# Patient Record
Sex: Male | Born: 1965 | Race: White | Hispanic: No | Marital: Married | Smoking: Never smoker
Health system: Southern US, Community
[De-identification: ages and names within clinical notes are randomized; demographics above are authoritative.]

## PROBLEM LIST (undated history)

## (undated) DIAGNOSIS — G4730 Sleep apnea, unspecified: Secondary | ICD-10-CM

---

## 2007-11-24 ENCOUNTER — Ambulatory Visit: Payer: Self-pay | Admitting: Unknown Physician Specialty

## 2013-01-09 ENCOUNTER — Ambulatory Visit: Payer: Self-pay | Admitting: Unknown Physician Specialty

## 2013-02-12 ENCOUNTER — Ambulatory Visit: Payer: Self-pay | Admitting: Otolaryngology

## 2015-11-09 ENCOUNTER — Other Ambulatory Visit: Payer: Self-pay | Admitting: Orthopedic Surgery

## 2015-11-09 DIAGNOSIS — M542 Cervicalgia: Secondary | ICD-10-CM

## 2015-11-09 DIAGNOSIS — Z77018 Contact with and (suspected) exposure to other hazardous metals: Secondary | ICD-10-CM

## 2015-11-11 ENCOUNTER — Ambulatory Visit
Admission: RE | Admit: 2015-11-11 | Discharge: 2015-11-11 | Disposition: A | Discharge status: Home / Self Care | Payer: BLUE CROSS/BLUE SHIELD | Source: Ambulatory Visit | Attending: Orthopedic Surgery | Admitting: Orthopedic Surgery

## 2015-11-11 DIAGNOSIS — Z77018 Contact with and (suspected) exposure to other hazardous metals: Secondary | ICD-10-CM

## 2015-11-13 ENCOUNTER — Ambulatory Visit
Admission: RE | Admit: 2015-11-13 | Discharge: 2015-11-13 | Disposition: A | Discharge status: Home / Self Care | Payer: BLUE CROSS/BLUE SHIELD | Source: Ambulatory Visit | Attending: Orthopedic Surgery | Admitting: Orthopedic Surgery

## 2015-11-13 ENCOUNTER — Other Ambulatory Visit: Payer: Self-pay

## 2015-11-13 DIAGNOSIS — M542 Cervicalgia: Secondary | ICD-10-CM

## 2015-11-18 ENCOUNTER — Other Ambulatory Visit: Payer: Self-pay

## 2015-12-01 ENCOUNTER — Ambulatory Visit: Payer: BLUE CROSS/BLUE SHIELD | Admitting: Anesthesiology

## 2015-12-06 ENCOUNTER — Ambulatory Visit: Payer: BLUE CROSS/BLUE SHIELD | Attending: Anesthesiology | Admitting: Anesthesiology

## 2015-12-06 ENCOUNTER — Encounter: Payer: Self-pay | Admitting: Anesthesiology

## 2015-12-06 VITALS — BP 82/61 | HR 68 | Temp 96.7°F | Resp 16 | Ht 73.0 in | Wt 247.0 lb

## 2015-12-06 DIAGNOSIS — M5412 Radiculopathy, cervical region: Secondary | ICD-10-CM | POA: Diagnosis not present

## 2015-12-06 DIAGNOSIS — M47812 Spondylosis without myelopathy or radiculopathy, cervical region: Secondary | ICD-10-CM | POA: Insufficient documentation

## 2015-12-06 DIAGNOSIS — M542 Cervicalgia: Secondary | ICD-10-CM | POA: Diagnosis present

## 2015-12-06 DIAGNOSIS — M25512 Pain in left shoulder: Secondary | ICD-10-CM | POA: Diagnosis not present

## 2015-12-06 DIAGNOSIS — M25519 Pain in unspecified shoulder: Secondary | ICD-10-CM | POA: Diagnosis present

## 2015-12-06 NOTE — Progress Notes (Signed)
Safety precautions to be maintained throughout the outpatient stay will include: orient to surroundings, keep bed in low position, maintain call bell within reach at all times, provide assistance with transfer out of bed and ambulation.  

## 2015-12-07 NOTE — Progress Notes (Signed)
Subjective:  Patient ID: IVAAN SILERIO, male    DOB: 1966/02/03  Age: 50 y.o. MRN: [id number]  CC: Neck Pain and Shoulder Pain   HPI DONQUEZ PIOLI presents for new onset left shoulder pain. This is also associated with neck pain. Jamarques reports that his discomfort began following a recent work out about a month and a half ago. He noticed severe onset of left hairy scapular posterior back pain with some radiating pain from his neck into the posterior portion of his left shoulder and radiating into the back of the left upper arm. Since that time he has utilized some chiropractic manipulation with no improvement as well as an oral prednisone Dosepak with no improvement. Pain had been up to a maximum VAS score of 10 but is now down to a 1-4. It has shown some improvement over the last several days. He denies any problems with upper extremity strength or function. No numbness or tingling are noted in the hand or wrist area. He has refrain from heavy lifting since that event and he does note that he does have some pain upon awakening and sleeping at night. Pain is described as sharp and shooting and he did have a recent MRI revealed some evidence of degenerative disc changes at C5-6 and C6-7 with some foraminal stenosis and thecal ventral impingement.  History Raydon has no past medical history on file.   He has no past surgical history on file.   His family history includes Cancer in his maternal grandfather, mother, and paternal grandfather; Depression in his father and paternal grandfather; Hyperlipidemia in his father; Hypertension in his father.He reports that he has never smoked. He does not have any smokeless tobacco history on file. His alcohol and drug histories are not on file.   ---------------------------------------------------------------------------------------------------------------------- History reviewed. No pertinent past medical history.  History reviewed. No pertinent past  surgical history.  Family History  Problem Relation Age of Onset  . Cancer Mother   . Depression Father   . Hyperlipidemia Father   . Hypertension Father   . Cancer Maternal Grandfather   . Cancer Paternal Grandfather   . Depression Paternal Grandfather     Social History  Substance Use Topics  . Smoking status: Never Smoker   . Smokeless tobacco: Not on file  . Alcohol Use: Not on file    ---------------------------------------------------------------------------------------------------------------------- Social History   Social History  . Marital Status: Married    Spouse Name: N/A  . Number of Children: N/A  . Years of Education: N/A   Social History Main Topics  . Smoking status: Never Smoker   . Smokeless tobacco: None  . Alcohol Use: None  . Drug Use: None  . Sexual Activity: Not Asked   Other Topics Concern  . None   Social History Narrative  . None      ----------------------------------------------------------------------------------------------------------------------  ROS Review of Systems   Negative pulmonary except for sleep apnea  negative cardiac  negative GI  negative GU  Objective:  BP 82/61 mmHg  Pulse 68  Temp(Src) 96.7 F (35.9 C) (Oral)  Resp 16  Ht 6\' 1"  (1.854 m)  Wt 247 lb (112.038 kg)  BMI 32.59 kg/m2  SpO2 99%  Physical Exam   Pupils are equally round and reactive to light with extraocular muscles intact Heart is regular rate and rhythm without murmurs Lungs clear also rotation bilaterally without wheezes Inspection of the atlantooccipital joint reveals some pain with lateral motion and anterior-posterior flexion and extension. There  is no crepitus. He has good range of motion with good strength in the splenius capitis muscles and peripheral neck musculature. He has some mild tenderness in the left posterior trapezius muscle and his upper shoulder strength to flexion and extension at the upper arms and wrists appears to  be intact grip strength is also intact with sensation intact     Assessment & Plan:   Stanford was seen today for neck pain and shoulder pain.  Diagnoses and all orders for this visit:  Cervicalgia -     Ambulatory referral to Physical Therapy  Cervical radiculitis -     Ambulatory referral to Physical Therapy     ----------------------------------------------------------------------------------------------------------------------  Problem List Items Addressed This Visit    None    Visit Diagnoses    Cervicalgia    -  Primary    Relevant Orders    Ambulatory referral to Physical Therapy    Cervical radiculitis        Relevant Orders    Ambulatory referral to Physical Therapy       ----------------------------------------------------------------------------------------------------------------------  1. Cervicalgia  - Ambulatory referral to Physical Therapy  2. Cervical radiculitis Left C5 distribution - Ambulatory referral to Physical Therapy    ----------------------------------------------------------------------------------------------------------------------  I am having Mr. Martindelcampo maintain his aspirin, amoxicillin, and cetirizine.   Meds ordered this encounter  Medications  . aspirin 81 MG tablet    Sig: Take 81 mg by mouth daily.  Marland Kitchen amoxicillin (AMOXIL) 875 MG tablet    Sig: Take 875 mg by mouth 2 (two) times daily.  . cetirizine (ZYRTEC) 10 MG tablet    Sig: Take 10 mg by mouth daily.       Follow-up:At this point I feel that Kevaughn is made some good progress with conservative care. We have talked about options regarding further management including restriction a certain weight lifting techniques and I'm going to also send him for physical therapy evaluation and management with possible TENS application and traction.  Return in about 1 month (around 01/03/2016) for evaluation. We've also talked about consideration or possibly a cervical epidural  steroid injection if this condition persists or worsen at this point I do not feel that it is indicated   Molli Barrows, MD

## 2016-01-03 ENCOUNTER — Ambulatory Visit: Payer: BLUE CROSS/BLUE SHIELD | Admitting: Anesthesiology

## 2016-03-15 ENCOUNTER — Other Ambulatory Visit: Payer: Self-pay

## 2016-06-09 IMAGING — MR MR CERVICAL SPINE W/O CM
5 series · 31 of 48 positions shown · non-contrast
Comparison: None.

CLINICAL DATA: Neck and left shoulder and arm pain for 2 weeks.
Pain began after working out.

EXAM:
MRI CERVICAL SPINE WITHOUT CONTRAST
TECHNIQUE: Multiplanar, multisequence MR imaging of the cervical spine was
performed. No intravenous contrast was administered.

[Series 3: T2 · sagittal · 3.0mm · 0.66mm/px · 6 of 12 slices shown (1 of 2)]
[im 1/12]
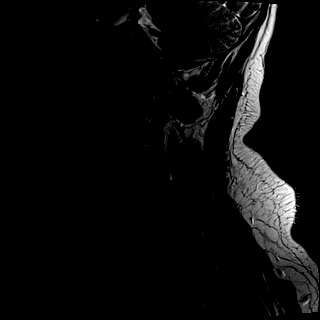
[im 3/12]
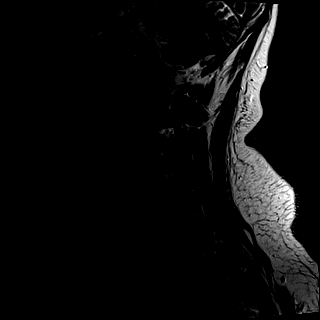
[im 5/12]
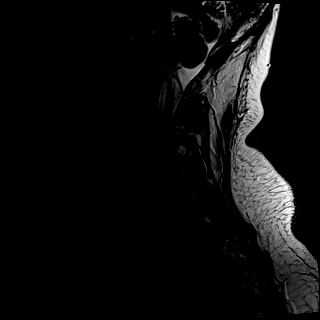
[im 7/12]
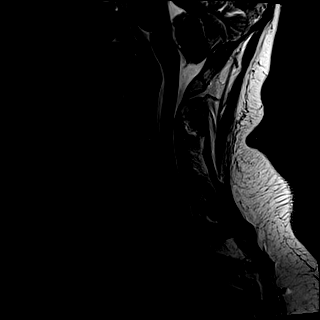
[im 9/12]
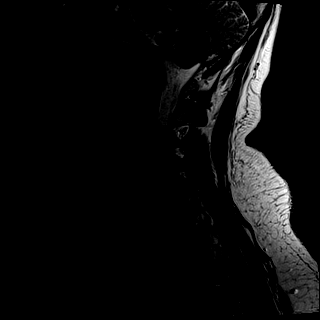
[im 12/12]
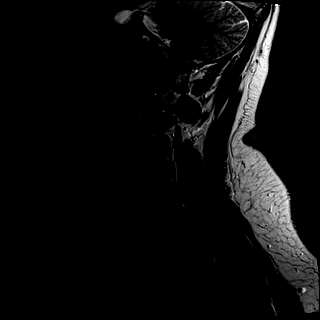

[Series 4: T1 · sagittal · 3.0mm · 0.66mm/px · 6 of 12 slices shown]
[im 1/12]
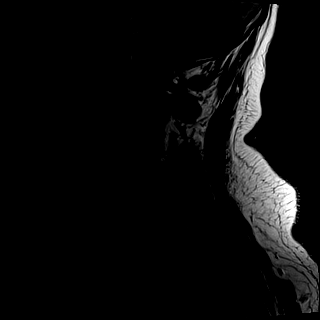
[im 3/12]
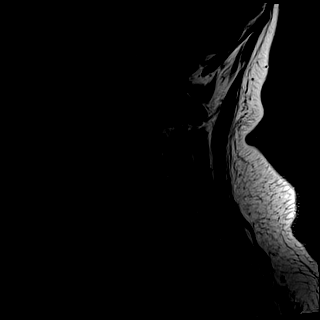
[im 5/12]
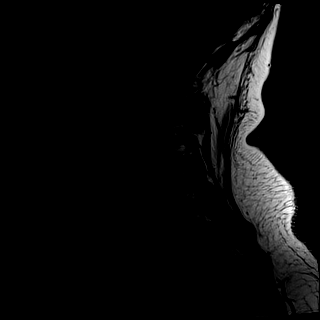
[im 7/12]
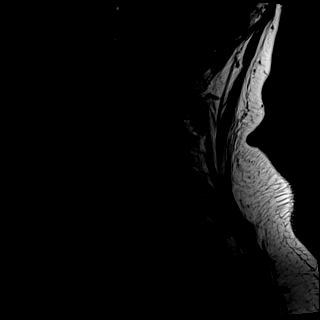
[im 9/12]
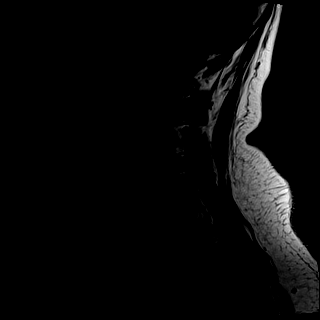
[im 12/12]
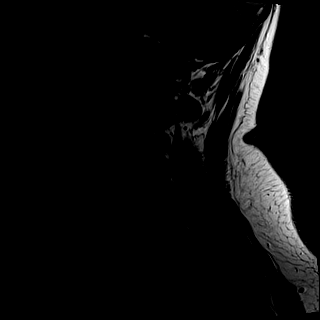

[Series 5: T2 · axial · 3.0mm · 0.56mm/px · z∈[-64,+44]mm · 9 of 30 slices shown (2 of 2)]
[im 1/30]
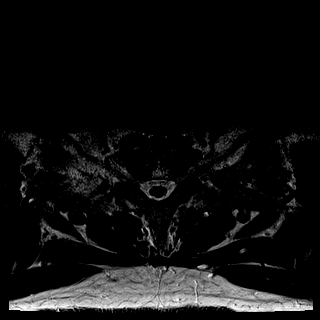
[im 5/30]
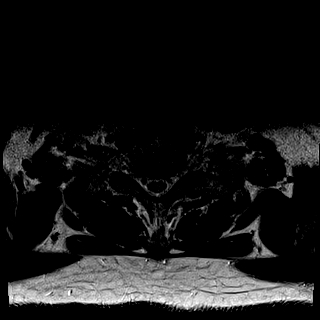
[im 9/30]
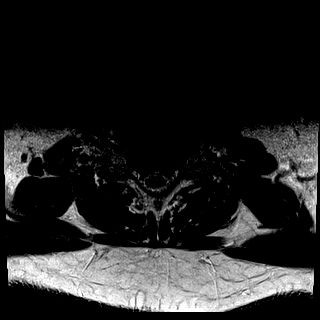
[im 13/30]
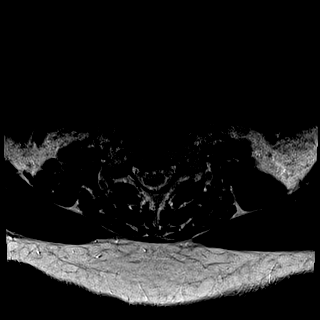
[im 15/30]
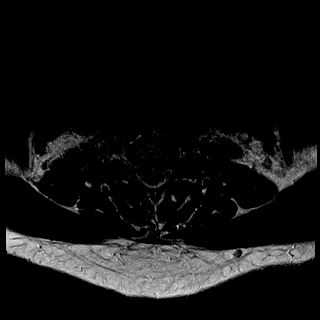
[im 17/30]
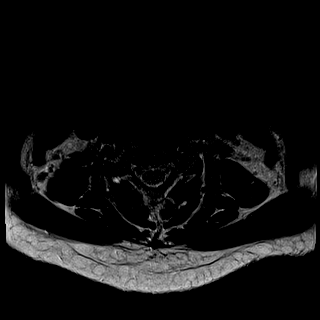
[im 21/30]
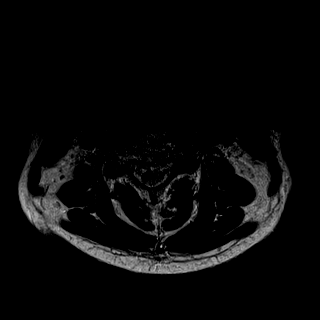
[im 25/30]
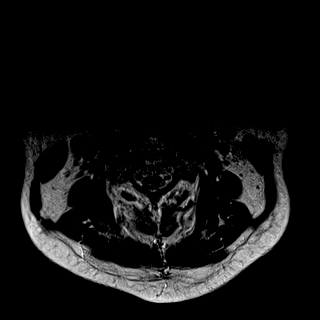
[im 30/30]
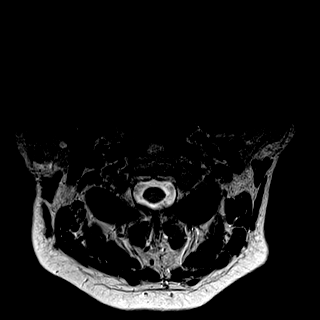

[Series 6: tir sag · sagittal · 3.0mm · 0.41mm/px · 6 of 12 slices shown]
[im 1/12]
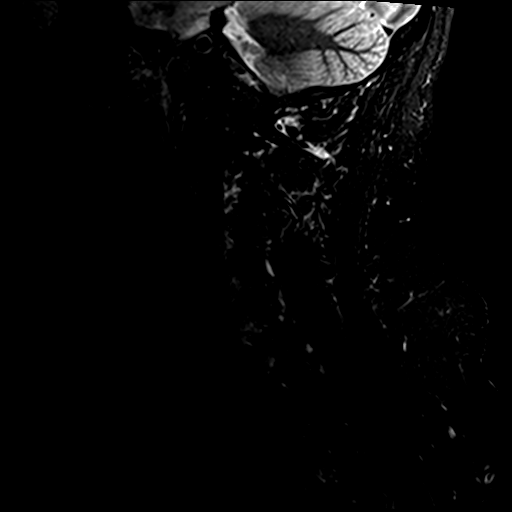
[im 3/12]
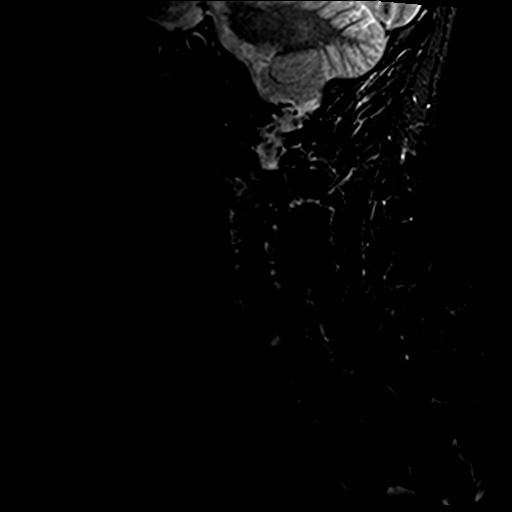
[im 5/12]
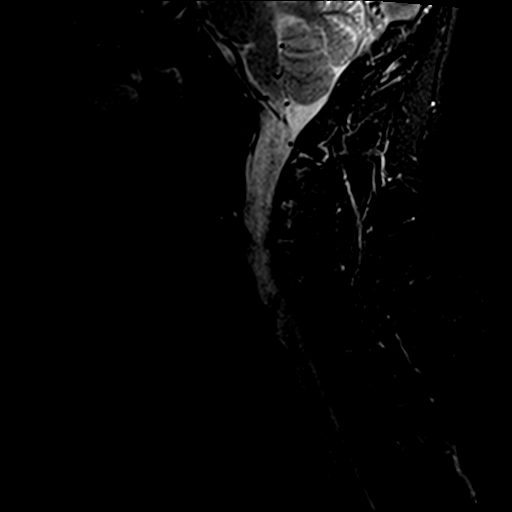
[im 7/12]
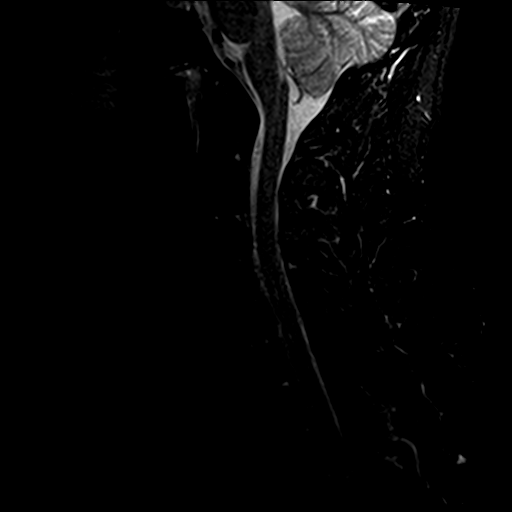
[im 9/12]
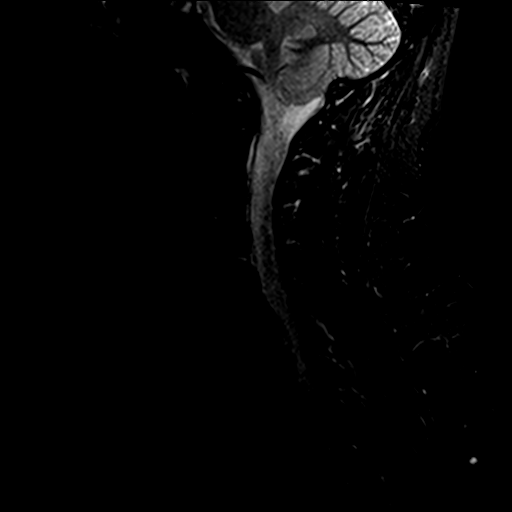
[im 12/12]
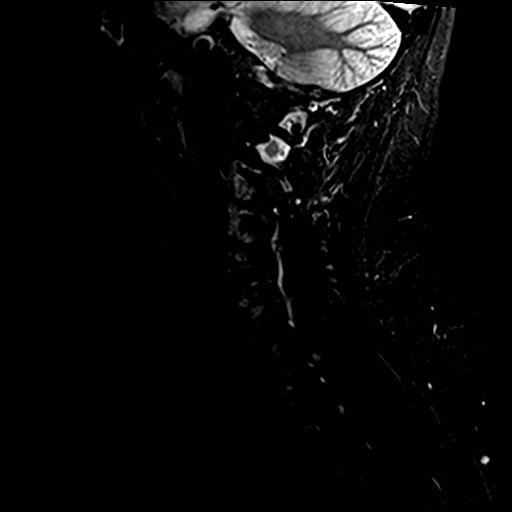

[Series 7: GRE · axial · 3.0mm · 0.35mm/px · z∈[-64,-19]mm · 4 of 30 slices shown]
[im 1/30]
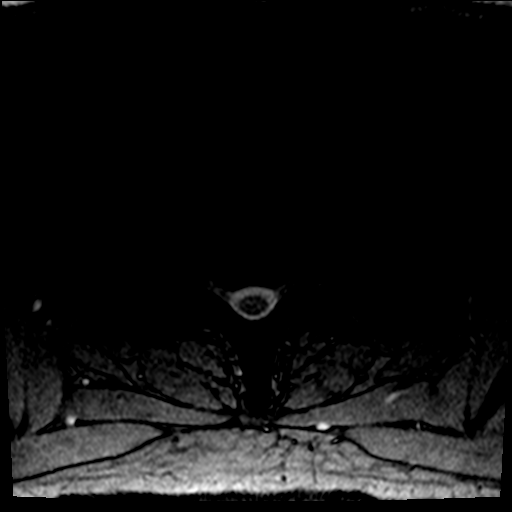
[im 5/30]
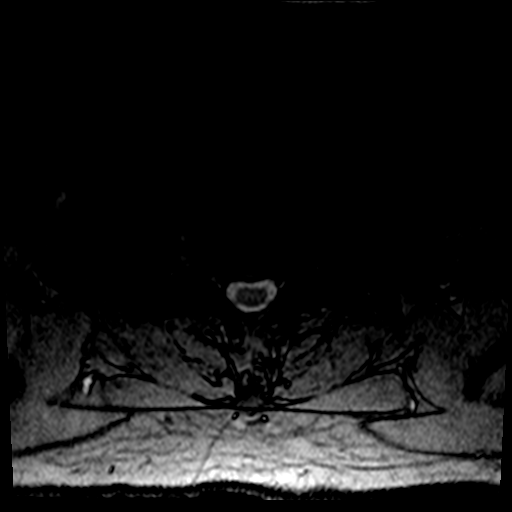
[im 9/30]
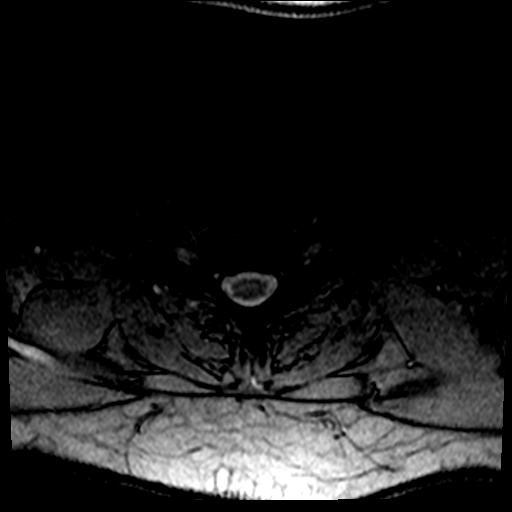
[im 13/30]
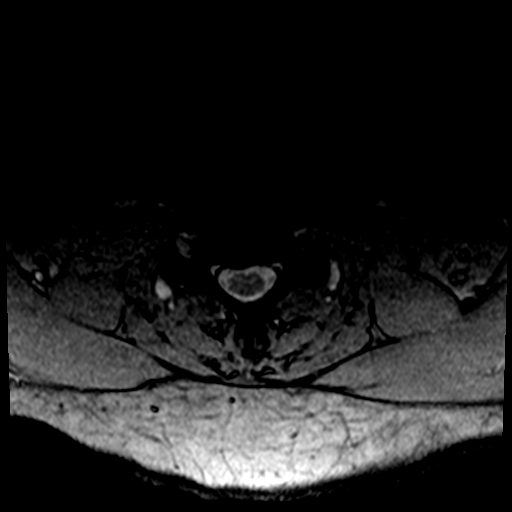

[31 of 48 positions shown; findings below may reference images not displayed]

FINDINGS: Normal alignment of the cervical vertebral bodies. They demonstrate
normal marrow signal except for a few small scattered hemangiomas.
The facets are normally aligned. No abnormal STIR signal intensity
in the posterior elements, assess or paraspinal muscles. The
cervical spinal cord demonstrates normal signal intensity. No cord
lesions or syrinx.

C2-3:  No significant findings.

C3-4:  No significant findings.

C4-5:  No significant findings.

C5-6: Diffuse bulging annulus, osteophytic ridging and uncinate
spurring with mild mass effect on the ventral thecal sac and
narrowing of the ventral CSF space. There is a small focal left
paracentral/ medial foraminal disc osteophyte complex likely
impinging on the left C6 nerve root and possibly responsible for the
patient's left arm symptoms.

C6-7: Degenerative disc disease with bilateral disc osteophyte
complexes with mass effect on both sides of the thecal sac and
encroachment on both C7 nerve roots.

C7-T1: Small central disc protrusion but no direct neural
compression. No foraminal stenosis.
IMPRESSION: 1. Left paracentral/medial foraminal disc osteophyte complex at C5-6
likely impinging on the left C6 nerve root and possible responsible
for the patient's left arm symptoms.
2. Bilateral disc osteophyte complexes at C6-7 with encroachment on
both C7 nerve roots.
3. Small central disc protrusion at C7-T1 but no neural compression.

## 2016-12-04 ENCOUNTER — Encounter: Payer: Self-pay | Admitting: *Deleted

## 2016-12-05 ENCOUNTER — Encounter
Admission: RE | Disposition: A | Discharge status: Home / Self Care | Payer: Self-pay | Source: Ambulatory Visit | Attending: Unknown Physician Specialty

## 2016-12-05 ENCOUNTER — Ambulatory Visit
Admission: RE | Admit: 2016-12-05 | Discharge: 2016-12-05 | Disposition: A | Discharge status: Home / Self Care | Payer: BLUE CROSS/BLUE SHIELD | Source: Ambulatory Visit | Attending: Unknown Physician Specialty | Admitting: Unknown Physician Specialty

## 2016-12-05 ENCOUNTER — Ambulatory Visit: Payer: BLUE CROSS/BLUE SHIELD | Admitting: Anesthesiology

## 2016-12-05 ENCOUNTER — Encounter: Payer: Self-pay | Admitting: Anesthesiology

## 2016-12-05 DIAGNOSIS — K269 Duodenal ulcer, unspecified as acute or chronic, without hemorrhage or perforation: Secondary | ICD-10-CM | POA: Diagnosis not present

## 2016-12-05 DIAGNOSIS — Z9989 Dependence on other enabling machines and devices: Secondary | ICD-10-CM | POA: Diagnosis not present

## 2016-12-05 DIAGNOSIS — Z7982 Long term (current) use of aspirin: Secondary | ICD-10-CM | POA: Insufficient documentation

## 2016-12-05 DIAGNOSIS — R1310 Dysphagia, unspecified: Secondary | ICD-10-CM | POA: Diagnosis present

## 2016-12-05 DIAGNOSIS — G4730 Sleep apnea, unspecified: Secondary | ICD-10-CM | POA: Diagnosis not present

## 2016-12-05 DIAGNOSIS — K2950 Unspecified chronic gastritis without bleeding: Secondary | ICD-10-CM | POA: Insufficient documentation

## 2016-12-05 HISTORY — DX: Sleep apnea, unspecified: G47.30

## 2016-12-05 HISTORY — PX: ESOPHAGOGASTRODUODENOSCOPY (EGD) WITH PROPOFOL: SHX5813

## 2016-12-05 SURGERY — ESOPHAGOGASTRODUODENOSCOPY (EGD) WITH PROPOFOL
Anesthesia: General

## 2016-12-05 MED ORDER — LIDOCAINE HCL (PF) 1 % IJ SOLN
2.0000 mL | Freq: Once | INTRAMUSCULAR | Status: AC
  Administered 2016-12-05: 0.3 mL via INTRADERMAL

## 2016-12-05 MED ORDER — LIDOCAINE HCL (CARDIAC) 20 MG/ML IV SOLN
INTRAVENOUS | Status: DC | PRN
  Administered 2016-12-05: 100 mg via INTRAVENOUS

## 2016-12-05 MED ORDER — PROPOFOL 10 MG/ML IV BOLUS
INTRAVENOUS | Status: DC | PRN
  Administered 2016-12-05: 40 mg via INTRAVENOUS
  Administered 2016-12-05: 30 mg via INTRAVENOUS
  Administered 2016-12-05: 100 mg via INTRAVENOUS

## 2016-12-05 MED ORDER — LIDOCAINE HCL (PF) 1 % IJ SOLN
INTRAMUSCULAR | Status: AC
  Administered 2016-12-05: 0.3 mL via INTRADERMAL
  Filled 2016-12-05: qty 2

## 2016-12-05 MED ORDER — PROPOFOL 500 MG/50ML IV EMUL
INTRAVENOUS | Status: DC | PRN
  Administered 2016-12-05: 140 ug/kg/min via INTRAVENOUS

## 2016-12-05 MED ORDER — GLYCOPYRROLATE 0.2 MG/ML IV SOSY
PREFILLED_SYRINGE | INTRAVENOUS | Status: DC | PRN
  Administered 2016-12-05: .2 mg via INTRAVENOUS

## 2016-12-05 MED ORDER — GLYCOPYRROLATE 0.2 MG/ML IJ SOLN
INTRAMUSCULAR | Status: AC
  Filled 2016-12-05: qty 1

## 2016-12-05 MED ORDER — LIDOCAINE HCL (PF) 2 % IJ SOLN
INTRAMUSCULAR | Status: AC
  Filled 2016-12-05: qty 2

## 2016-12-05 MED ORDER — PROPOFOL 500 MG/50ML IV EMUL
INTRAVENOUS | Status: AC
  Filled 2016-12-05: qty 50

## 2016-12-05 MED ORDER — SODIUM CHLORIDE 0.9 % IV SOLN: INTRAVENOUS | Status: DC

## 2016-12-05 MED ORDER — SODIUM CHLORIDE 0.9 % IV SOLN
INTRAVENOUS | Status: DC
Start: 2016-12-05 — End: 2016-12-05
  Administered 2016-12-05: 1000 mL via INTRAVENOUS

## 2016-12-05 NOTE — H&P (Signed)
   Primary Care Physician:  Rusty Aus, MD Primary Gastroenterologist:  Dr. Vira Agar  Pre-Procedure History & Physical: HPI:  Zachary Schaefer is a 51 y.o. male is here for an endoscopy.   Past Medical History:  Diagnosis Date  . Sleep apnea     History reviewed. No pertinent surgical history.  Prior to Admission medications   Medication Sig Start Date End Date Taking? Authorizing Provider  amoxicillin (AMOXIL) 875 MG tablet Take 875 mg by mouth 2 (two) times daily.    Historical Provider, MD  aspirin 81 MG tablet Take 81 mg by mouth daily.    Historical Provider, MD  cetirizine (ZYRTEC) 10 MG tablet Take 10 mg by mouth daily.    Historical Provider, MD    Allergies as of 12/04/2016  . (No Known Allergies)    Family History  Problem Relation Age of Onset  . Cancer Mother   . Depression Father   . Hyperlipidemia Father   . Hypertension Father   . Cancer Maternal Grandfather   . Cancer Paternal Grandfather   . Depression Paternal Grandfather     Social History   Social History  . Marital status: Married    Spouse name: N/A  . Number of children: N/A  . Years of education: N/A   Occupational History  . Not on file.   Social History Main Topics  . Smoking status: Never Smoker  . Smokeless tobacco: Not on file  . Alcohol use Not on file  . Drug use: Unknown  . Sexual activity: Not on file   Other Topics Concern  . Not on file   Social History Narrative  . No narrative on file    Review of Systems: See HPI, otherwise negative ROS  Physical Exam: BP 126/76   Pulse 71   Temp 97 F (36.1 C) (Tympanic)   Resp 18   Ht 6\' 1"  (1.854 m)   Wt 114.8 kg (253 lb)   SpO2 100%   BMI 33.38 kg/m  General:   Alert,  pleasant and cooperative in NAD Head:  Normocephalic and atraumatic. Neck:  Supple; no masses or thyromegaly. Lungs:  Clear throughout to auscultation.    Heart:  Regular rate and rhythm. Abdomen:  Soft, nontender and nondistended. Normal bowel  sounds, without guarding, and without rebound.   Neurologic:  Alert and  oriented x4;  grossly normal neurologically.  Impression/Plan: Zachary Schaefer is here for an endoscopy to be performed for dysphagia  Risks, benefits, limitations, and alternatives regarding  endoscopy have been reviewed with the patient.  Questions have been answered.  All parties agreeable.   Gaylyn Cheers, MD  12/05/2016, 11:30 AM

## 2016-12-05 NOTE — Transfer of Care (Signed)
Immediate Anesthesia Transfer of Care Note  Patient: Zachary Schaefer  Procedure(s) Performed: Procedure(s): ESOPHAGOGASTRODUODENOSCOPY (EGD) WITH PROPOFOL (N/A)  Patient Location: PACU  Anesthesia Type:General  Level of Consciousness: awake and alert   Airway & Oxygen Therapy: Patient Spontanous Breathing and Patient connected to nasal cannula oxygen  Post-op Assessment: Report given to RN and Post -op Vital signs reviewed and stable  Post vital signs: Reviewed and stable  Last Vitals:  Vitals:   12/05/16 1102  BP: 126/76  Pulse: 71  Resp: 18  Temp: 36.1 C    Last Pain:  Vitals:   12/05/16 1102  TempSrc: Tympanic         Complications: No apparent anesthesia complications

## 2016-12-05 NOTE — Anesthesia Post-op Follow-up Note (Signed)
Anesthesia QCDR form completed.        

## 2016-12-05 NOTE — Op Note (Signed)
[facility] Patient Name: Zachary Schaefer Procedure Date: 12/05/2016 11:17 AM MRN: [id number] Account #: [id number] Date of Birth: 07/28/1966 Admit Type: Outpatient Age: 52 Room: [facility] ENDO ROOM 4 Gender: Male Note Status: Finalized Procedure:            Upper GI endoscopy Indications:          Dysphagia Providers:            Manya Silvas, MD Referring MD:         Rusty Aus, MD (Referring MD) Medicines:            Propofol per Anesthesia Complications:        No immediate complications. Procedure:            Pre-Anesthesia Assessment:                       - After reviewing the risks and benefits, the patient                        was deemed in satisfactory condition to undergo the                        procedure.                       After obtaining informed consent, the endoscope was                        passed under direct vision. Throughout the procedure,                        the patient's blood pressure, pulse, and oxygen                        saturations were monitored continuously. The                        Colonoscope was introduced through the mouth, and                        advanced to the second part of duodenum. The upper GI                        endoscopy was accomplished without difficulty. The                        patient tolerated the procedure well. Findings:      Diffuse moderate mucosal changes were found in the upper third of the       esophagus, in the middle third of the esophagus and in the lower third       of the esophagus.      Mucosal changes including ringed esophagus and feline appearance were       found in the upper third of the esophagus, in the middle third of the       esophagus and in the lower third of the esophagus. Biopsies were       obtained from the proximal and distal esophagus with cold forceps for       histology of suspected eosinophilic esophagitis.      Diffuse minimal  inflammation characterized by erythema and granularity  was found in the gastric antrum. Biopsies were taken with a cold forceps       for histology. Biopsies were taken with a cold forceps for Helicobacter       pylori testing.      One non-bleeding superficial duodenal ulcer with no stigmata of bleeding       was found in the duodenal bulb.      The second portion of the duodenum was normal. Biopsies were taken with       a cold forceps for histology.      After complete exam done I passed a guide wire and withdrew the scope       and passed a 45 and 48 F Savary dilator. Impression:           - Mucosal changes in the esophagus.                       - Esophageal mucosal changes consistent with                        eosinophilic esophagitis. Biopsied.                       - Gastritis. Biopsied.                       - One non-bleeding duodenal ulcer with no stigmata of                        bleeding.                       - Normal second portion of the duodenum. Biopsied. Recommendation:       - Await pathology results. Manya Silvas, MD 12/05/2016 11:55:12 AM This report has been signed electronically. Number of Addenda: 0 Note Initiated On: 12/05/2016 11:17 AM      [facility]

## 2016-12-05 NOTE — Anesthesia Preprocedure Evaluation (Signed)
Anesthesia Evaluation  Patient identified by MRN, date of birth, ID band Patient awake    Reviewed: Allergy & Precautions, H&P , NPO status , Patient's Chart, lab work & pertinent test results  History of Anesthesia Complications Negative for: history of anesthetic complications  Airway Mallampati: III  TM Distance: >3 FB Neck ROM: full    Dental  (+) Poor Dentition, Chipped   Pulmonary neg shortness of breath, sleep apnea and Continuous Positive Airway Pressure Ventilation ,    Pulmonary exam normal breath sounds clear to auscultation       Cardiovascular Exercise Tolerance: Good (-) angina(-) Past MI and (-) DOE negative cardio ROS Normal cardiovascular exam Rhythm:regular Rate:Normal     Neuro/Psych negative neurological ROS  negative psych ROS   GI/Hepatic Neg liver ROS, GERD  Controlled,  Endo/Other  negative endocrine ROS  Renal/GU negative Renal ROS  negative genitourinary   Musculoskeletal   Abdominal   Peds  Hematology negative hematology ROS (+)   Anesthesia Other Findings Past Medical History: No date: Sleep apnea  History reviewed. No pertinent surgical history.  BMI    Body Mass Index:  33.38 kg/m      Reproductive/Obstetrics negative OB ROS                             Anesthesia Physical Anesthesia Plan  ASA: III  Anesthesia Plan: General   Post-op Pain Management:    Induction:   Airway Management Planned:   Additional Equipment:   Intra-op Plan:   Post-operative Plan:   Informed Consent: I have reviewed the patients History and Physical, chart, labs and discussed the procedure including the risks, benefits and alternatives for the proposed anesthesia with the patient or authorized representative who has indicated his/her understanding and acceptance.   Dental Advisory Given  Plan Discussed with: Anesthesiologist, CRNA and Surgeon  Anesthesia Plan  Comments:         Anesthesia Quick Evaluation

## 2016-12-05 NOTE — Anesthesia Postprocedure Evaluation (Signed)
Anesthesia Post Note  Patient: Zachary Schaefer  Procedure(s) Performed: Procedure(s) (LRB): ESOPHAGOGASTRODUODENOSCOPY (EGD) WITH PROPOFOL (N/A)  Patient location during evaluation: Endoscopy Anesthesia Type: General Level of consciousness: awake and alert Pain management: pain level controlled Vital Signs Assessment: post-procedure vital signs reviewed and stable Respiratory status: spontaneous breathing, nonlabored ventilation, respiratory function stable and patient connected to nasal cannula oxygen Cardiovascular status: blood pressure returned to baseline and stable Postop Assessment: no signs of nausea or vomiting Anesthetic complications: no     Last Vitals:  Vitals:   12/05/16 1210 12/05/16 1220  BP: 114/76 115/73  Pulse: (!) 56 (!) 57  Resp: 15 11  Temp:      Last Pain:  Vitals:   12/05/16 1149  TempSrc: Tympanic                 [name]

## 2016-12-06 ENCOUNTER — Encounter: Payer: Self-pay | Admitting: Unknown Physician Specialty

## 2016-12-06 LAB — SURGICAL PATHOLOGY

## 2021-11-28 ENCOUNTER — Ambulatory Visit: Admit: 2021-11-28 | Payer: BLUE CROSS/BLUE SHIELD

## 2022-01-29 NOTE — Anesthesia Preprocedure Evaluation (Addendum)
Anesthesia Evaluation  ?Patient identified by MRN, date of birth, ID band ?Patient awake ? ? ? ?Reviewed: ?Allergy & Precautions, H&P , NPO status , Patient's Chart, lab work & pertinent test results ? ?History of Anesthesia Complications ?Negative for: history of anesthetic complications ? ?Airway ?Mallampati: II ? ?TM Distance: >3 FB ?Neck ROM: full ? ? ? Dental ?no notable dental hx. ? ?  ?Pulmonary ?neg shortness of breath, sleep apnea and Continuous Positive Airway Pressure Ventilation ,  ?  ?Pulmonary exam normal ?breath sounds clear to auscultation ? ? ? ? ? ? Cardiovascular ?Exercise Tolerance: Good ?(-) angina(-) Past MI and (-) DOE negative cardio ROS ?Normal cardiovascular exam ?Rhythm:regular Rate:Normal ? ? ?  ?Neuro/Psych ?negative neurological ROS ? negative psych ROS  ? GI/Hepatic ?Neg liver ROS, GERD  Controlled,  ?Endo/Other  ?negative endocrine ROS ? Renal/GU ?negative Renal ROS  ?negative genitourinary ?  ?Musculoskeletal ? ? Abdominal ?Normal abdominal exam  (+)   ?Peds ? Hematology ?negative hematology ROS ?(+)   ?Anesthesia Other Findings ?Past Medical History: ?No date: Sleep apnea ? ?History reviewed. No pertinent surgical history. ? ?BMI   ? Body Mass Index:  33.38 kg/m?  ?  ? ? Reproductive/Obstetrics ?negative OB ROS ? ?  ? ? ? ? ? ? ? ? ? ? ? ? ? ?  ?  ? ? ? ? ? ? ? ?Anesthesia Physical ? ?Anesthesia Plan ? ?ASA: 2 ? ?Anesthesia Plan: General  ? ?Post-op Pain Management:   ? ?Induction: Intravenous ? ?PONV Risk Score and Plan:  ? ?Airway Management Planned: Natural Airway ? ?Additional Equipment:  ? ?Intra-op Plan:  ? ?Post-operative Plan:  ? ?Informed Consent: I have reviewed the patients History and Physical, chart, labs and discussed the procedure including the risks, benefits and alternatives for the proposed anesthesia with the patient or authorized representative who has indicated his/her understanding and acceptance.  ? ? ? ?Dental advisory  given ? ?Plan Discussed with: Anesthesiologist, CRNA and Surgeon ? ?Anesthesia Plan Comments:   ? ? ? ? ? ?Anesthesia Quick Evaluation ? ?

## 2022-01-30 ENCOUNTER — Encounter: Payer: Self-pay | Admitting: *Deleted

## 2022-01-30 ENCOUNTER — Encounter
Admission: RE | Disposition: A | Discharge status: Home / Self Care | Payer: Self-pay | Source: Home / Self Care | Attending: Gastroenterology

## 2022-01-30 ENCOUNTER — Ambulatory Visit: Payer: BC Managed Care – PPO | Admitting: Anesthesiology

## 2022-01-30 ENCOUNTER — Other Ambulatory Visit: Payer: Self-pay

## 2022-01-30 ENCOUNTER — Ambulatory Visit
Admission: RE | Admit: 2022-01-30 | Discharge: 2022-01-30 | Disposition: A | Discharge status: Home / Self Care | Payer: BC Managed Care – PPO | Attending: Gastroenterology | Admitting: Gastroenterology

## 2022-01-30 DIAGNOSIS — K640 First degree hemorrhoids: Secondary | ICD-10-CM | POA: Diagnosis not present

## 2022-01-30 DIAGNOSIS — D127 Benign neoplasm of rectosigmoid junction: Secondary | ICD-10-CM | POA: Insufficient documentation

## 2022-01-30 DIAGNOSIS — Z1211 Encounter for screening for malignant neoplasm of colon: Secondary | ICD-10-CM | POA: Diagnosis not present

## 2022-01-30 DIAGNOSIS — K219 Gastro-esophageal reflux disease without esophagitis: Secondary | ICD-10-CM | POA: Diagnosis not present

## 2022-01-30 DIAGNOSIS — G4730 Sleep apnea, unspecified: Secondary | ICD-10-CM | POA: Insufficient documentation

## 2022-01-30 DIAGNOSIS — K635 Polyp of colon: Secondary | ICD-10-CM | POA: Insufficient documentation

## 2022-01-30 DIAGNOSIS — Z800 Family history of malignant neoplasm of digestive organs: Secondary | ICD-10-CM | POA: Diagnosis present

## 2022-01-30 HISTORY — PX: COLONOSCOPY WITH PROPOFOL: SHX5780

## 2022-01-30 SURGERY — COLONOSCOPY WITH PROPOFOL
Anesthesia: General

## 2022-01-30 SURGERY — COLONOSCOPY
Anesthesia: General

## 2022-01-30 MED ORDER — LIDOCAINE HCL (CARDIAC) PF 100 MG/5ML IV SOSY
PREFILLED_SYRINGE | INTRAVENOUS | Status: DC | PRN
  Administered 2022-01-30: 50 mg via INTRAVENOUS

## 2022-01-30 MED ORDER — SODIUM CHLORIDE 0.9 % IV SOLN: INTRAVENOUS | Status: DC

## 2022-01-30 MED ORDER — EPHEDRINE SULFATE (PRESSORS) 50 MG/ML IJ SOLN
INTRAMUSCULAR | Status: DC | PRN
  Administered 2022-01-30 (×2): 10 mg via INTRAVENOUS

## 2022-01-30 MED ORDER — LIDOCAINE HCL (PF) 1 % IJ SOLN
INTRAMUSCULAR | Status: DC
Start: 2022-01-30 — End: 2022-01-30
  Filled 2022-01-30: qty 5

## 2022-01-30 MED ORDER — PROPOFOL 500 MG/50ML IV EMUL
INTRAVENOUS | Status: AC
  Filled 2022-01-30: qty 50

## 2022-01-30 MED ORDER — PROPOFOL 10 MG/ML IV BOLUS
INTRAVENOUS | Status: AC
  Filled 2022-01-30: qty 20

## 2022-01-30 MED ORDER — DEXMEDETOMIDINE HCL IN NACL 200 MCG/50ML IV SOLN
INTRAVENOUS | Status: DC | PRN
  Administered 2022-01-30 (×2): 8 ug via INTRAVENOUS

## 2022-01-30 MED ORDER — PROPOFOL 500 MG/50ML IV EMUL
INTRAVENOUS | Status: DC | PRN
  Administered 2022-01-30: 180 ug/kg/min via INTRAVENOUS

## 2022-01-30 MED ORDER — DEXMEDETOMIDINE HCL IN NACL 80 MCG/20ML IV SOLN
INTRAVENOUS | Status: AC
  Filled 2022-01-30: qty 20

## 2022-01-30 MED ORDER — PROPOFOL 10 MG/ML IV BOLUS
INTRAVENOUS | Status: DC | PRN
  Administered 2022-01-30: 40 mg via INTRAVENOUS
  Administered 2022-01-30: 100 mg via INTRAVENOUS

## 2022-01-30 MED ORDER — LIDOCAINE HCL (PF) 2 % IJ SOLN
INTRAMUSCULAR | Status: AC
  Filled 2022-01-30: qty 5

## 2022-01-30 NOTE — Transfer of Care (Signed)
Immediate Anesthesia Transfer of Care Note ? ?Patient: Zachary Schaefer ? ?Procedure(s) Performed: COLONOSCOPY WITH PROPOFOL ? ?Patient Location: PACU and Endoscopy Unit ? ?Anesthesia Type:General ? ?Level of Consciousness: drowsy and patient cooperative ? ?Airway & Oxygen Therapy: Patient Spontanous Breathing ? ?Post-op Assessment: Report given to RN and Post -op Vital signs reviewed and stable ? ?Post vital signs: Reviewed and stable ? ?Last Vitals:  ?Vitals Value Taken Time  ?BP 87/60 01/30/22 0830  ?Temp    ?Pulse 75 01/30/22 0831  ?Resp 21 01/30/22 0831  ?SpO2 97 % 01/30/22 0831  ?Vitals shown include unvalidated device data. ? ?Last Pain:  ?Vitals:  ? 01/30/22 0820  ?TempSrc: Temporal  ?PainSc:   ?   ? ?  ? ?Complications: No notable events documented. ?

## 2022-01-30 NOTE — Anesthesia Postprocedure Evaluation (Signed)
Anesthesia Post Note ? ?Patient: Zachary Schaefer ? ?Procedure(s) Performed: COLONOSCOPY WITH PROPOFOL ? ?Patient location during evaluation: Endoscopy ?Anesthesia Type: General ?Level of consciousness: awake and alert ?Pain management: pain level controlled ?Vital Signs Assessment: post-procedure vital signs reviewed and stable ?Respiratory status: spontaneous breathing, nonlabored ventilation and respiratory function stable ?Cardiovascular status: blood pressure returned to baseline and stable ?Postop Assessment: no apparent nausea or vomiting ?Anesthetic complications: no ? ? ?No notable events documented. ? ? ?Last Vitals:  ?Vitals:  ? 01/30/22 0840 01/30/22 0850  ?BP: (!) 113/55 112/69  ?Pulse: 66 61  ?Resp: 15 19  ?Temp:    ?SpO2: 98% 100%  ?  ?Last Pain:  ?Vitals:  ? 01/30/22 0820  ?TempSrc: Temporal  ?PainSc:   ? ? ?  ?  ?  ?  ?  ?  ? ?[place]uch ? ? ? ? ?

## 2022-01-30 NOTE — Op Note (Signed)
[facility] ?Gastroenterology ?Patient Name: Zachary Schaefer ?Procedure Date: 01/30/2022 7:50 AM ?MRN: [id number] ?Account #: [id number] ?Date of Birth: 04/10/1966 ?Admit Type: Outpatient ?Age: 56 ?Room: [facility] ENDO ROOM 1 ?Gender: Male ?Note Status: Finalized ?Instrument Name: Colonscope [id number] ?Procedure:             Colonoscopy ?Indications:           Colon cancer screening in patient at increased risk:  ?                       Family history of colorectal cancer in multiple 2nd  ?                       degree relatives ?Providers:             Andrey Farmer MD, MD ?Referring MD:          Rusty Aus, MD (Referring MD) ?Medicines:             Monitored Anesthesia Care ?Complications:         No immediate complications. Estimated blood loss:  ?                       Minimal. ?Procedure:             Pre-Anesthesia Assessment: ?                       - Prior to the procedure, a History and Physical was  ?                       performed, and patient medications and allergies were  ?                       reviewed. The patient is competent. The risks and  ?                       benefits of the procedure and the sedation options and  ?                       risks were discussed with the patient. All questions  ?                       were answered and informed consent was obtained.  ?                       Patient identification and proposed procedure were  ?                       verified by the physician, the nurse, the  ?                       anesthesiologist, the anesthetist and the technician  ?                       in the endoscopy suite. Mental Status Examination:  ?                       alert and oriented. Airway Examination: normal  ?  oropharyngeal airway and neck mobility. Respiratory  ?                       Examination: clear to auscultation. CV Examination:  ?                       normal. Prophylactic Antibiotics: The patient does not  ?                        require prophylactic antibiotics. Prior  ?                       Anticoagulants: The patient has taken no previous  ?                       anticoagulant or antiplatelet agents except for  ?                       aspirin. ASA Grade Assessment: I - A normal, healthy  ?                       patient. After reviewing the risks and benefits, the  ?                       patient was deemed in satisfactory condition to  ?                       undergo the procedure. The anesthesia plan was to use  ?                       monitored anesthesia care (MAC). Immediately prior to  ?                       administration of medications, the patient was  ?                       re-assessed for adequacy to receive sedatives. The  ?                       heart rate, respiratory rate, oxygen saturations,  ?                       blood pressure, adequacy of pulmonary ventilation, and  ?                       response to care were monitored throughout the  ?                       procedure. The physical status of the patient was  ?                       re-assessed after the procedure. ?                       After obtaining informed consent, the colonoscope was  ?                       passed under direct vision. Throughout the procedure,  ?  the patient's blood pressure, pulse, and oxygen  ?                       saturations were monitored continuously. The  ?                       Colonoscope was introduced through the anus and  ?                       advanced to the the terminal ileum. The colonoscopy  ?                       was somewhat difficult due to significant looping.  ?                       Successful completion of the procedure was aided by  ?                       applying abdominal pressure. The patient tolerated the  ?                       procedure well. The quality of the bowel preparation  ?                       was good. ?Findings: ?     The perianal and digital rectal examinations were  normal. ?     The terminal ileum appeared normal. ?     A less than 1 mm polyp was found in the ascending colon. The polyp was  ?     sessile. The polyp was removed with a jumbo cold forceps. Resection and  ?     retrieval were complete. Estimated blood loss was minimal. ?     A 2 mm polyp was found in the recto-sigmoid colon. The polyp was  ?     sessile. The polyp was removed with a cold snare. Resection and  ?     retrieval were complete. Estimated blood loss was minimal. ?     Internal hemorrhoids were found during retroflexion. The hemorrhoids  ?     were Grade I (internal hemorrhoids that do not prolapse). ?     The exam was otherwise without abnormality on direct and retroflexion  ?     views. ?Impression:            - The examined portion of the ileum was normal. ?                       - One less than 1 mm polyp in the ascending colon,  ?                       removed with a jumbo cold forceps. Resected and  ?                       retrieved. ?                       - One 2 mm polyp at the recto-sigmoid colon, removed  ?                       with a cold snare.  Resected and retrieved. ?                       - Internal hemorrhoids. ?                       - The examination was otherwise normal on direct and  ?                       retroflexion views. ?Recommendation:        - Discharge patient to home. ?                       - Resume previous diet. ?                       - Continue present medications. ?                       - Await pathology results. ?                       - Repeat colonoscopy for surveillance based on  ?                       pathology results. ?                       - Return to referring physician as previously  ?                       scheduled. ?Procedure Code(s):     --- Professional --- ?                       [phone], Colonoscopy, flexible; with removal of  ?                       tumor(s), polyp(s), or other lesion(s) by snare  ?                       technique ?                        45380, 59, Colonoscopy, flexible; with biopsy, single  ?                       or multiple ?Diagnosis Code(s):     --- Professional --- ?                       Z80.0, Family history of malignant neoplasm of  ?                       digestive organs ?                       K64.0, First degree hemorrhoids ?                       K63.5, Polyp of colon ?CPT copyright 2019 [employer]. All rights reserved. ?The codes documented in this report are preliminary and upon coder review may  ?be revised to meet current compliance requirements. ?Andrey Farmer MD, MD ?01/30/2022 8:27:37 AM ?Number  of Addenda: 0 ?Note Initiated On: 01/30/2022 7:50 AM ?Scope Withdrawal Time: 0 hours 11 minutes 41 seconds  ?Total Procedure Duration: 0 hours 24 minutes 2 seconds  ?Estimated Blood Loss:  Estimated blood loss was minimal. ?     [facility] ?

## 2022-01-30 NOTE — Interval H&P Note (Signed)
History and Physical Interval Note: ? ?01/30/2022 ?7:51 AM ? ?Zachary Schaefer  has presented today for surgery, with the diagnosis of family hx.of colon cancer.  The various methods of treatment have been discussed with the patient and family. After consideration of risks, benefits and other options for treatment, the patient has consented to  Procedure(s): ?COLONOSCOPY WITH PROPOFOL (N/A) as a surgical intervention.  The patient's history has been reviewed, patient examined, no change in status, stable for surgery.  I have reviewed the patient's chart and labs.  Questions were answered to the patient's satisfaction.   ? ? ?[name] ? ?Ok to proceed with colonoscopy ?

## 2022-01-30 NOTE — H&P (Signed)
Outpatient short stay form Pre-procedure ?01/30/2022  ?Zachary Rubenstein, MD ? ?Primary Physician: Rusty Aus, MD ? ?Reason for visit:  Screening ? ?History of present illness:   ? ?56 y/o gentleman with family history of colon cancer in two of his grandparents. Last colonoscopy was in 2014 and was unremarkable. No blood thinners. No abdominal surgeries. No new symptoms. ? ? ? ?Current Facility-Administered Medications:  ?  0.9 %  sodium chloride infusion, , Intravenous, Continuous, Tacara Hadlock, Hilton Cork, MD, Last Rate: 20 mL/hr at 01/30/22 0738, New Bag at 01/30/22 0738 ?  lidocaine (PF) (XYLOCAINE) 1 % injection, , , ,  ? ?Medications Prior to Admission  ?Medication Sig Dispense Refill Last Dose  ? aspirin 81 MG tablet Take 81 mg by mouth daily.   Past Week  ? cetirizine (ZYRTEC) 10 MG tablet Take 10 mg by mouth daily.   Past Week  ? Multiple Vitamin (MULTIVITAMIN) tablet Take 1 tablet by mouth daily.   Past Week  ? amoxicillin (AMOXIL) 875 MG tablet Take 875 mg by mouth 2 (two) times daily. (Patient not taking: Reported on 01/30/2022)   Completed Course  ? ? ? ?No Known Allergies ? ? ?Past Medical History:  ?Diagnosis Date  ? Sleep apnea   ? ? ?Review of systems:  Otherwise negative.  ? ? ?Physical Exam ? ?Gen: Alert, oriented. Appears stated age.  ?HEENT: PERRLA. ?Lungs: No respiratory distress ?CV: RRR ?Abd: soft, benign, no masses ?Ext: No edema ? ? ? ?Planned procedures: Proceed with colonoscopy. The patient understands the nature of the planned procedure, indications, risks, alternatives and potential complications including but not limited to bleeding, infection, perforation, damage to internal organs and possible oversedation/side effects from anesthesia. The patient agrees and gives consent to proceed.  ?Please refer to procedure notes for findings, recommendations and patient disposition/instructions.  ? ? ? ?Zachary Rubenstein, MD ?[facility] ? ? ? ?  ? ?

## 2022-01-31 ENCOUNTER — Encounter: Payer: Self-pay | Admitting: Gastroenterology

## 2022-02-07 LAB — SURGICAL PATHOLOGY

## 2022-07-13 ENCOUNTER — Encounter: Payer: Self-pay | Admitting: Emergency Medicine

## 2022-07-13 ENCOUNTER — Other Ambulatory Visit: Payer: Self-pay

## 2022-07-13 ENCOUNTER — Emergency Department
Admission: EM | Admit: 2022-07-13 | Discharge: 2022-07-13 | Disposition: A | Discharge status: Home / Self Care | Payer: BC Managed Care – PPO | Attending: Emergency Medicine | Admitting: Emergency Medicine

## 2022-07-13 DIAGNOSIS — W3089XA Contact with other specified agricultural machinery, initial encounter: Secondary | ICD-10-CM | POA: Diagnosis not present

## 2022-07-13 DIAGNOSIS — T1512XA Foreign body in conjunctival sac, left eye, initial encounter: Secondary | ICD-10-CM | POA: Diagnosis present

## 2022-07-13 DIAGNOSIS — T1592XA Foreign body on external eye, part unspecified, left eye, initial encounter: Secondary | ICD-10-CM

## 2022-07-13 MED ORDER — ERYTHROMYCIN 5 MG/GM OP OINT: 1.0000 | TOPICAL_OINTMENT | Freq: Four times a day (QID) | OPHTHALMIC | 0 refills | Status: AC

## 2022-07-13 MED ORDER — TETRACAINE HCL 0.5 % OP SOLN
2.0000 [drp] | Freq: Once | OPHTHALMIC | Status: AC
Start: 2022-07-13 — End: 2022-07-13
  Administered 2022-07-13: 2 [drp] via OPHTHALMIC
  Filled 2022-07-13: qty 4

## 2022-07-13 MED ORDER — FLUORESCEIN SODIUM 1 MG OP STRP
1.0000 | ORAL_STRIP | Freq: Once | OPHTHALMIC | Status: AC
  Administered 2022-07-13: 1 via OPHTHALMIC
  Filled 2022-07-13: qty 1

## 2022-07-13 MED ORDER — ERYTHROMYCIN 5 MG/GM OP OINT
TOPICAL_OINTMENT | Freq: Once | OPHTHALMIC | Status: AC
  Administered 2022-07-13: 1 via OPHTHALMIC
  Filled 2022-07-13: qty 1

## 2022-07-13 NOTE — ED Provider Notes (Signed)
[facility] Provider Note    Event Date/Time   First MD Initiated Contact with Patient 07/13/22 1943     (approximate)   History   Eye Problem   HPI  Zachary Schaefer is a 56 y.o. male with no significant past medical history and as listed in the EMR presents to the emergency department for treatment and evaluation of the left eye irritation.  While outside on his tractor he started having a sensation of foreign body in the eye.  Patient feels that it is in the upper part of the eye or under the eyelid.  Irritation occurs when he blinks.  He has no vision changes.      Physical Exam   Triage Vital Signs: ED Triage Vitals  Enc Vitals Group     BP 07/13/22 1921 130/76     Pulse Rate 07/13/22 1921 (!) 55     Resp 07/13/22 1921 18     Temp 07/13/22 1921 98.2 F (36.8 C)     Temp Source 07/13/22 1921 Oral     SpO2 07/13/22 1921 97 %     Weight 07/13/22 1922 240 lb (108.9 kg)     Height 07/13/22 1922 '6\' 1"'$  (1.854 m)     Head Circumference --      Peak Flow --      Pain Score 07/13/22 1928 5     Pain Loc --      Pain Edu? --      Excl. in [place]? --     Most recent vital signs: Vitals:   07/13/22 1921 07/13/22 2033  BP: 130/76 115/70  Pulse: (!) 55 (!) 55  Resp: 18 18  Temp: 98.2 F (36.8 C)   SpO2: 97% 98%     General: Awake, no distress.  CV:  Good peripheral perfusion.  Resp:  Normal effort.  Abd:  No distention.  Other:  Exam of the left thigh shows mild irritation and erythema.  No obvious retained foreign body on magnified exam.  A small brown fleck was noted on the left eyelid when everted and was removed with a saline soaked sterile Q-tip.  Fluorescein stain exam is without corneal abrasion.   ED Results / Procedures / Treatments   Labs (all labs ordered are listed, but only abnormal results are displayed) Labs Reviewed - No data to display   EKG  Not indicated   RADIOLOGY Not indicated   PROCEDURES:  Critical Care  performed: No  Procedures   MEDICATIONS ORDERED IN ED: Medications  tetracaine (PONTOCAINE) 0.5 % ophthalmic solution 2 drop (2 drops Right Eye Given by Other 07/13/22 2020)  fluorescein ophthalmic strip 1 strip (1 strip Right Eye Given by Other 07/13/22 2020)  erythromycin ophthalmic ointment (1 Application Left Eye Given 07/13/22 2029)     IMPRESSION / MDM / ASSESSMENT AND PLAN / ED COURSE  I reviewed the triage vital signs and the nursing notes.                              Differential diagnosis includes, but is not limited to, retained foreign body of the eye, corneal abrasion  Patient's presentation is most consistent with acute, uncomplicated illness.  56 year old male presenting to the emergency department for treatment and evaluation of foreign body sensation to the left eye.  See HPI for further details.  Exam findings as described above.  A small brown fleck was removed  after eversion of the upper eyelid.  He will be covered with erythromycin ophthalmic ointment and encouraged to see ophthalmology if not improving over the next 2 to 3 days.  ER return precautions discussed as well.     FINAL CLINICAL IMPRESSION(S) / ED DIAGNOSES   Final diagnoses:  Foreign body, eye, left, initial encounter     Rx / DC Orders   ED Discharge Orders          Ordered    erythromycin ophthalmic ointment  4 times daily        07/13/22 2026             Note:  This document was prepared using Dragon voice recognition software and may include unintentional dictation errors.   Victorino Dike, FNP 07/13/22 2133    Naaman Plummer, MD 07/13/22 [phone]

## 2022-07-13 NOTE — ED Notes (Signed)
Provider CBT at bedside. See triage note. Pt in with L eye pain. White of eye red/irritated. Pt reports mild change to vision. No bleeding, drainage, swelling noted. Pt in NAD. Visitor with pt.

## 2022-07-13 NOTE — ED Triage Notes (Signed)
Pt reports he was riding on a farm equipment and felt something get in his left eye. Pt reports every time he closes his eye he feels discomfort. Pt denies any other symptom

## 2022-07-13 NOTE — Discharge Instructions (Signed)
Please follow-up with ophthalmology if the irritation does not seem to be improving over the next few days.  Return to the emergency department if you develop any significant change in vision or eye pain and you are unable to see the ophthalmologist or your primary care provider.
